# Patient Record
Sex: Female | Born: 1965 | Race: Black or African American | Hispanic: No | Marital: Single | Smoking: Never smoker
Health system: Southern US, Community
[De-identification: ages and names within clinical notes are randomized; demographics above are authoritative.]

## PROBLEM LIST (undated history)

## (undated) DIAGNOSIS — R7611 Nonspecific reaction to tuberculin skin test without active tuberculosis: Secondary | ICD-10-CM

## (undated) DIAGNOSIS — G43909 Migraine, unspecified, not intractable, without status migrainosus: Secondary | ICD-10-CM

## (undated) HISTORY — DX: Migraine, unspecified, not intractable, without status migrainosus: G43.909

## (undated) HISTORY — DX: Nonspecific reaction to tuberculin skin test without active tuberculosis: R76.11

## (undated) HISTORY — PX: UTERINE FIBROID SURGERY: SHX826

## (undated) HISTORY — PX: ABDOMINAL HYSTERECTOMY: SHX81

## (undated) HISTORY — PX: MYOMECTOMY: SHX85

## (undated) HISTORY — PX: LEFT OOPHORECTOMY: SHX1961

---

## 2016-12-17 ENCOUNTER — Emergency Department
Admission: EM | Admit: 2016-12-17 | Discharge: 2016-12-17 | Disposition: A | Discharge status: Home / Self Care | Payer: Managed Care, Other (non HMO) | Attending: Emergency Medicine | Admitting: Emergency Medicine

## 2016-12-17 DIAGNOSIS — M5440 Lumbago with sciatica, unspecified side: Secondary | ICD-10-CM | POA: Insufficient documentation

## 2016-12-17 DIAGNOSIS — M545 Low back pain, unspecified: Secondary | ICD-10-CM

## 2016-12-17 LAB — URINALYSIS, COMPLETE (UACMP) WITH MICROSCOPIC
Bacteria, UA: NONE SEEN
Bilirubin Urine: NEGATIVE
Glucose, UA: NEGATIVE mg/dL
Hgb urine dipstick: NEGATIVE
KETONES UR: NEGATIVE mg/dL
Leukocytes, UA: NEGATIVE
Nitrite: NEGATIVE
PROTEIN: NEGATIVE mg/dL
Specific Gravity, Urine: 1.017 (ref 1.005–1.030)
pH: 6 (ref 5.0–8.0)

## 2016-12-17 MED ORDER — METHOCARBAMOL 500 MG PO TABS: 500.0000 mg | ORAL_TABLET | Freq: Four times a day (QID) | ORAL | 0 refills | Status: DC

## 2016-12-17 MED ORDER — KETOROLAC TROMETHAMINE 30 MG/ML IJ SOLN
30.0000 mg | Freq: Once | INTRAMUSCULAR | Status: AC
  Administered 2016-12-17: 30 mg via INTRAMUSCULAR
  Filled 2016-12-17: qty 1

## 2016-12-17 MED ORDER — IBUPROFEN 600 MG PO TABS: 600.0000 mg | ORAL_TABLET | Freq: Three times a day (TID) | ORAL | 0 refills | Status: DC | PRN

## 2016-12-17 NOTE — ED Notes (Signed)
NAD noted at time of D/C. Pt denies questions or concerns. Pt ambulatory to the lobby at this time.  

## 2016-12-17 NOTE — Discharge Instructions (Signed)
Follow up with [facility] if any continued problems  Ibuprofen for inflammation and pain, robaxin to use for muscle relaxant.   Do not take the muscle relaxant and drive. Ice or heat to your back as needed for comfort

## 2016-12-17 NOTE — ED Triage Notes (Signed)
Patient reports lower back pain for couple weeks.  Thought maybe she had lifted something and strained back.  Reports some relief with Tylenol or Aleve.

## 2016-12-17 NOTE — ED Provider Notes (Signed)
[facility] Emergency Department Provider Note  ____________________________________________   First MD Initiated Contact with Patient 12/17/16 [phone]     (approximate)  I have reviewed the triage vital signs and the nursing notes.   HISTORY  Chief Complaint Back Pain    HPI Maria Farley is a 51 y.o. female is here complaining of low back pain for several weeks. Patient states that she thought that she lifted something which strained her back. She's been taking Tylenol and Aleve with some relief. She denies any urinary symptoms or history of kidney stones. Patient states that pain is worse with deep inspiration and also with lying on her back. She denies any paresthesias, incontinence of bowel or bladder, or saddle anesthesias. Patient rates her pain as a 7 out of 10.   No past medical history on file.  There are no active problems to display for this patient.   No past surgical history on file.  Prior to Admission medications   Medication Sig Start Date End Date Taking? Authorizing Provider  ibuprofen (ADVIL,MOTRIN) 600 MG tablet Take 1 tablet (600 mg total) by mouth every 8 (eight) hours as needed. 12/17/16   Tommi RumpsSummers, Nikaya Nasby L, PA-C  methocarbamol (ROBAXIN) 500 MG tablet Take 1 tablet (500 mg total) by mouth 4 (four) times daily. 12/17/16   Tommi RumpsSummers, Aiman Noe L, PA-C    Allergies Clindamycin/lincomycin  No family history on file.  Social History Social History  Substance Use Topics  . Smoking status: Not on file  . Smokeless tobacco: Not on file  . Alcohol use Not on file    Review of Systems Constitutional: No fever/chills Cardiovascular: Denies chest pain. Respiratory: Denies shortness of breath. Gastrointestinal: No abdominal pain.  No nausea, no vomiting.  No diarrhea.  Genitourinary: Negative for dysuria. Musculoskeletal: Positive for low back pain. Skin: Negative for rash. Neurological: Negative for  focal weakness or  numbness.   ____________________________________________   PHYSICAL EXAM:  VITAL SIGNS: ED Triage Vitals  Enc Vitals Group     BP 12/17/16 0540 138/83     Pulse Rate 12/17/16 0540 74     Resp 12/17/16 0540 17     Temp 12/17/16 0540 97.7 F (36.5 C)     Temp Source 12/17/16 0540 Oral     SpO2 12/17/16 0540 98 %     Weight 12/17/16 0541 160 lb (72.6 kg)     Height 12/17/16 0541 5\' 11"  (1.803 m)     Head Circumference --      Peak Flow --      Pain Score 12/17/16 0539 7     Pain Loc --      Pain Edu? --      Excl. in GC? --     Constitutional: Alert and oriented. Well appearing and in no acute distress. Eyes: Conjunctivae are normal. PERRL. EOMI. Head: Atraumatic. Cardiovascular: Normal rate, regular rhythm. Grossly normal heart sounds.  Good peripheral circulation. Respiratory: Normal respiratory effort.  No retractions. Lungs CTAB. Gastrointestinal: Soft and nontender. No distention.  Musculoskeletal: Moves upper and lower extremities pain difficulty. Normal gait was noted. Good muscle strength bilaterally. Straight leg raises are negative. On  examination of the back there is soft tissue tenderness bilaterally paravertebral muscles. There is no tenderness on palpation of the vertebral bodies lumbar spine. Range of motion is unrestricted. Neurologic:  Normal speech and language. No gross focal neurologic deficits are appreciated. No gait instability. Skin:  Skin is warm, dry and intact. No rash noted.  Psychiatric: Mood and affect are normal. Speech and behavior are normal.  ____________________________________________   LABS (all labs ordered are listed, but only abnormal results are displayed)  Labs Reviewed  URINALYSIS, COMPLETE (UACMP) WITH MICROSCOPIC - Abnormal; Notable for the following:       Result Value   Color, Urine YELLOW (*)    APPearance CLEAR (*)    Squamous Epithelial / LPF 0-5 (*)    All other components within normal limits     PROCEDURES  Procedure(s) performed: None  Procedures  Critical Care performed: No  ____________________________________________   INITIAL IMPRESSION / ASSESSMENT AND PLAN / ED COURSE  Pertinent labs & imaging results that were available during my care of the patient were reviewed by me and considered in my medical decision making (see chart for details).  Patient is to follow-up with [facility] acute-care if any continued problems. She is given a prescription for ibuprofen 600 mg 3 times a day with food along with methocarbamol 500 mg one 4 times a day as needed for muscle spasms. She is aware that she cannot take this medication while driving. She was given Toradol 30 mg IM in the department. Patient was ambulatory without assistance after discharge.      ____________________________________________   FINAL CLINICAL IMPRESSION(S) / ED DIAGNOSES  Final diagnoses:  Acute bilateral low back pain without sciatica      NEW MEDICATIONS STARTED DURING THIS VISIT:  Discharge Medication List as of 12/17/2016  8:53 AM    START taking these medications   Details  ibuprofen (ADVIL,MOTRIN) 600 MG tablet Take 1 tablet (600 mg total) by mouth every 8 (eight) hours as needed., Starting Sun 12/17/2016, Print    methocarbamol (ROBAXIN) 500 MG tablet Take 1 tablet (500 mg total) by mouth 4 (four) times daily., Starting Sun 12/17/2016, Print         Note:  This document was prepared using Dragon voice recognition software and may include unintentional dictation errors.   Tommi Rumps, PA-C 12/17/16 1027    Governor Rooks, MD 12/17/16 [phone]

## 2018-04-17 ENCOUNTER — Other Ambulatory Visit: Payer: Self-pay

## 2018-04-17 ENCOUNTER — Ambulatory Visit (INDEPENDENT_AMBULATORY_CARE_PROVIDER_SITE_OTHER): Payer: Managed Care, Other (non HMO)

## 2018-04-17 ENCOUNTER — Encounter: Payer: Self-pay | Admitting: Emergency Medicine

## 2018-04-17 ENCOUNTER — Ambulatory Visit
Admission: EM | Admit: 2018-04-17 | Discharge: 2018-04-17 | Disposition: A | Discharge status: Home / Self Care | Payer: Managed Care, Other (non HMO) | Attending: Family Medicine | Admitting: Family Medicine

## 2018-04-17 DIAGNOSIS — X58XXXA Exposure to other specified factors, initial encounter: Secondary | ICD-10-CM

## 2018-04-17 DIAGNOSIS — M7652 Patellar tendinitis, left knee: Secondary | ICD-10-CM

## 2018-04-17 DIAGNOSIS — M25562 Pain in left knee: Secondary | ICD-10-CM | POA: Diagnosis not present

## 2018-04-17 DIAGNOSIS — S8392XA Sprain of unspecified site of left knee, initial encounter: Secondary | ICD-10-CM

## 2018-04-17 NOTE — ED Triage Notes (Signed)
Pt c/o left knee pain. Started about a week ago. She has knee swelling also. No known trauma. She feels like her knee will not hold her up.

## 2018-04-17 NOTE — Discharge Instructions (Signed)
Rest, ice, elevate, ibuprofen 600mg  three times daily

## 2018-04-17 NOTE — ED Provider Notes (Signed)
[facility]    CSN: [id number] Arrival date & time: 04/17/18  1723     History   Chief Complaint Chief Complaint  Patient presents with  . Knee Pain    left    HPI Maria Farley is a 52 y.o. female.   The history is provided by the patient.  Knee Pain  Location:  Knee Time since incident:  1 week Injury: no   Knee location:  L knee Pain details:    Quality:  Aching Chronicity:  New Dislocation: no   Foreign body present:  No foreign bodies Prior injury to area:  No Relieved by:  None tried Ineffective treatments:  None tried Associated symptoms: swelling   Associated symptoms: no back pain, no fatigue, no fever, no itching, no numbness, no stiffness and no tingling   Risk factors: no concern for non-accidental trauma, no frequent fractures, no known bone disorder, no obesity and no recent illness     History reviewed. No pertinent past medical history.  There are no active problems to display for this patient.   Past Surgical History:  Procedure Laterality Date  . ABDOMINAL HYSTERECTOMY    . UTERINE FIBROID SURGERY      OB History   None      Home Medications    Prior to Admission medications   Medication Sig Start Date End Date Taking? Authorizing Provider  ibuprofen (ADVIL,MOTRIN) 600 MG tablet Take 1 tablet (600 mg total) by mouth every 8 (eight) hours as needed. 12/17/16   Tommi Rumps, PA-C  methocarbamol (ROBAXIN) 500 MG tablet Take 1 tablet (500 mg total) by mouth 4 (four) times daily. 12/17/16   Tommi Rumps, PA-C    Family History History reviewed. No pertinent family history.  Social History Social History   Tobacco Use  . Smoking status: Never Smoker  . Smokeless tobacco: Never Used  Substance Use Topics  . Alcohol use: Yes  . Drug use: Never     Allergies   Clindamycin/lincomycin   Review of Systems Review of Systems  Constitutional: Negative for fatigue and fever.  Musculoskeletal: Negative for back  pain and stiffness.  Skin: Negative for itching.     Physical Exam Triage Vital Signs ED Triage Vitals  Enc Vitals Group     BP 04/17/18 1738 137/89     Pulse Rate 04/17/18 1738 88     Resp 04/17/18 1738 17     Temp 04/17/18 1738 98.9 F (37.2 C)     Temp Source 04/17/18 1738 Oral     SpO2 04/17/18 1738 97 %     Weight 04/17/18 1736 160 lb (72.6 kg)     Height 04/17/18 1736 4\' 11"  (1.499 m)     Head Circumference --      Peak Flow --      Pain Score 04/17/18 1736 4     Pain Loc --      Pain Edu? --      Excl. in GC? --    No data found.  Updated Vital Signs BP 137/89 (BP Location: Left Arm)   Pulse 88   Temp 98.9 F (37.2 C) (Oral)   Resp 17   Ht 4\' 11"  (1.499 m)   Wt 72.6 kg   SpO2 97%   BMI 32.32 kg/m   Visual Acuity Right Eye Distance:   Left Eye Distance:   Bilateral Distance:    Right Eye Near:   Left Eye Near:    Bilateral  Near:     Physical Exam  Constitutional: She appears well-developed and well-nourished. No distress.  Musculoskeletal:       Left knee: She exhibits swelling and bony tenderness. She exhibits normal range of motion, no effusion, no ecchymosis, no deformity, no laceration, no erythema, normal alignment, no LCL laxity, normal patellar mobility, normal meniscus and no MCL laxity. Tenderness found. Patellar tendon tenderness noted. No medial joint line, no lateral joint line, no MCL and no LCL tenderness noted.  Skin: She is not diaphoretic.  Nursing note and vitals reviewed.    UC Treatments / Results  Labs (all labs ordered are listed, but only abnormal results are displayed) Labs Reviewed - No data to display  EKG None  Radiology Dg Knee Complete 4 Views Left  Result Date: 04/17/2018 CLINICAL DATA:  Left knee pain for 1 week, no known injury, initial encounter EXAM: LEFT KNEE - COMPLETE 4+ VIEW COMPARISON:  None. FINDINGS: No evidence of fracture, dislocation, or joint effusion. No evidence of arthropathy or other focal bone  abnormality. Soft tissues are unremarkable. IMPRESSION: No acute abnormality noted. Electronically Signed   By: Alcide Clever M.D.   On: 04/17/2018 18:32    Procedures Procedures (including critical care time)  Medications Ordered in UC Medications - No data to display  Initial Impression / Assessment and Plan / UC Course  I have reviewed the triage vital signs and the nursing notes.  Pertinent labs & imaging results that were available during my care of the patient were reviewed by me and considered in my medical decision making (see chart for details).      Final Clinical Impressions(s) / UC Diagnoses   Final diagnoses:  Patellar tendinitis of left knee  Sprain of left knee, unspecified ligament, initial encounter    ED Prescriptions    None     1. x-ray results and diagnosis reviewed with patient 2. rx as per orders above; reviewed possible side effects, interactions, risks and benefits  3. Recommend supportive treatment with rest, ice, otc analgesics 4. Follow-up prn if symptoms worsen or don't improve    Controlled Substance Prescriptions [place] Controlled Substance Registry consulted? Not Applicable   Payton Mccallum, MD 04/17/18 2110

## 2019-03-28 IMAGING — CR DG KNEE COMPLETE 4+V*L*
4 series · 4 of 4 positions shown · non-contrast
Comparison: None.

CLINICAL DATA: Left knee pain for 1 week, no known injury, initial
encounter

EXAM:
LEFT KNEE - COMPLETE 4+ VIEW

[knee ap]
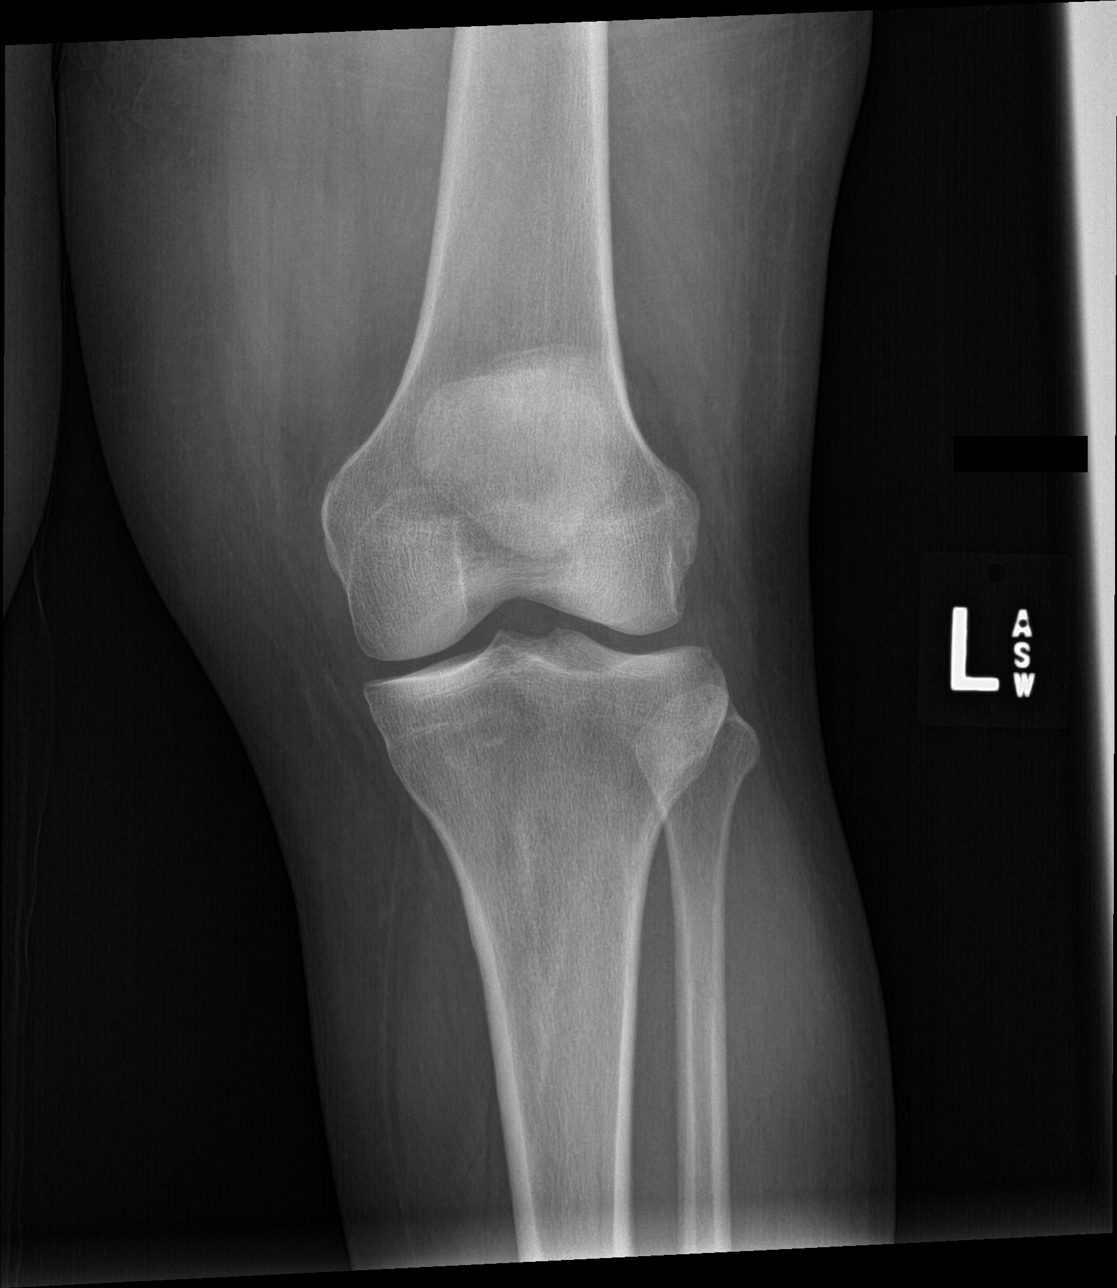

[knee lat]
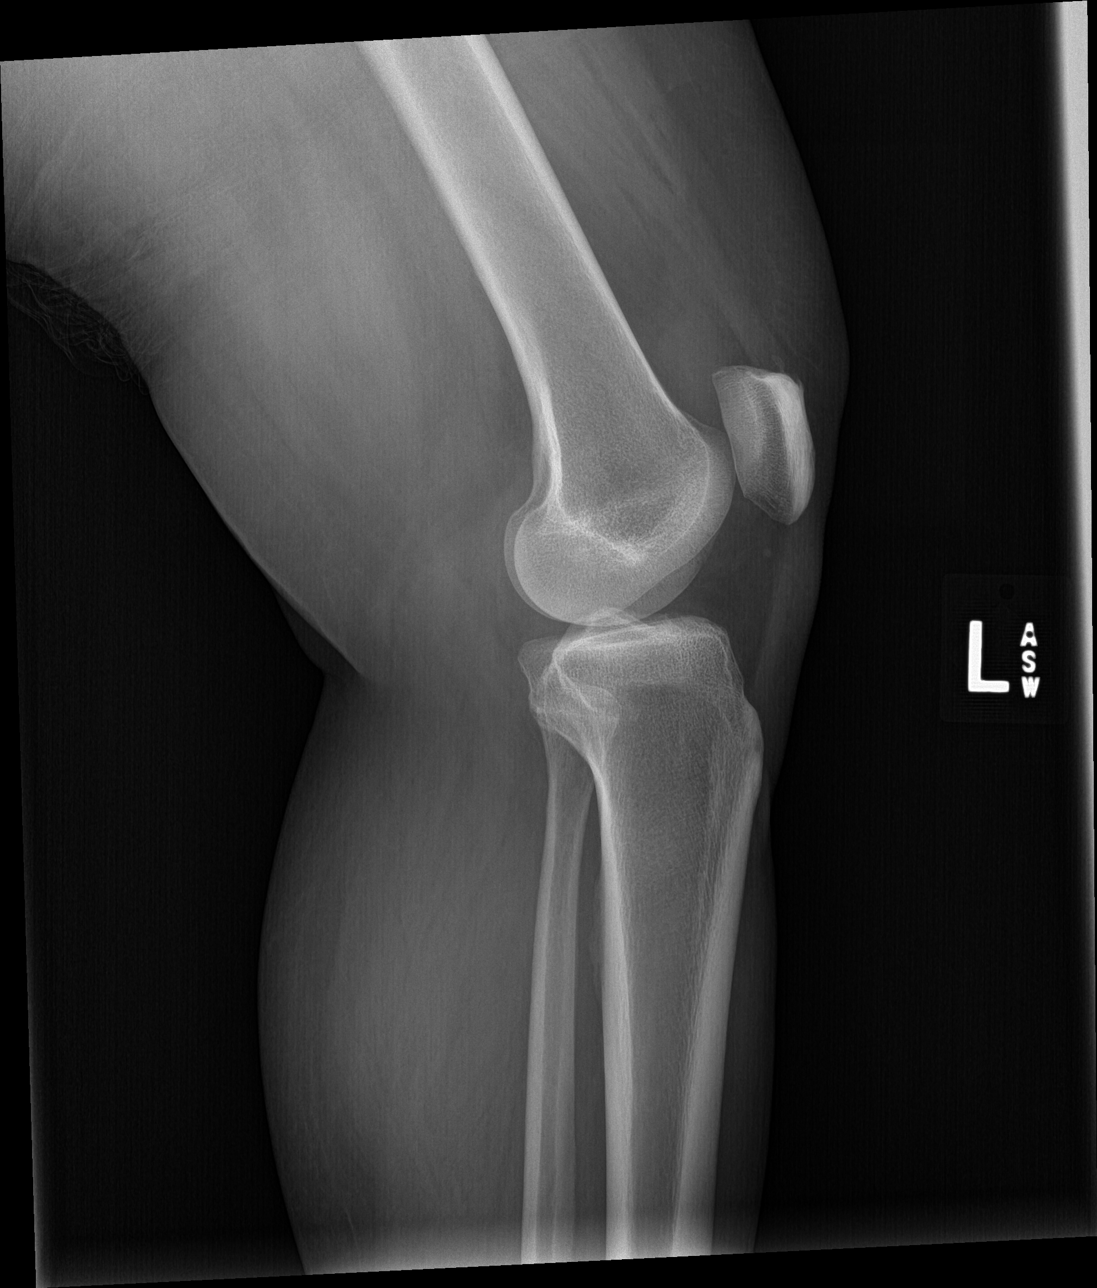

[tunnel]
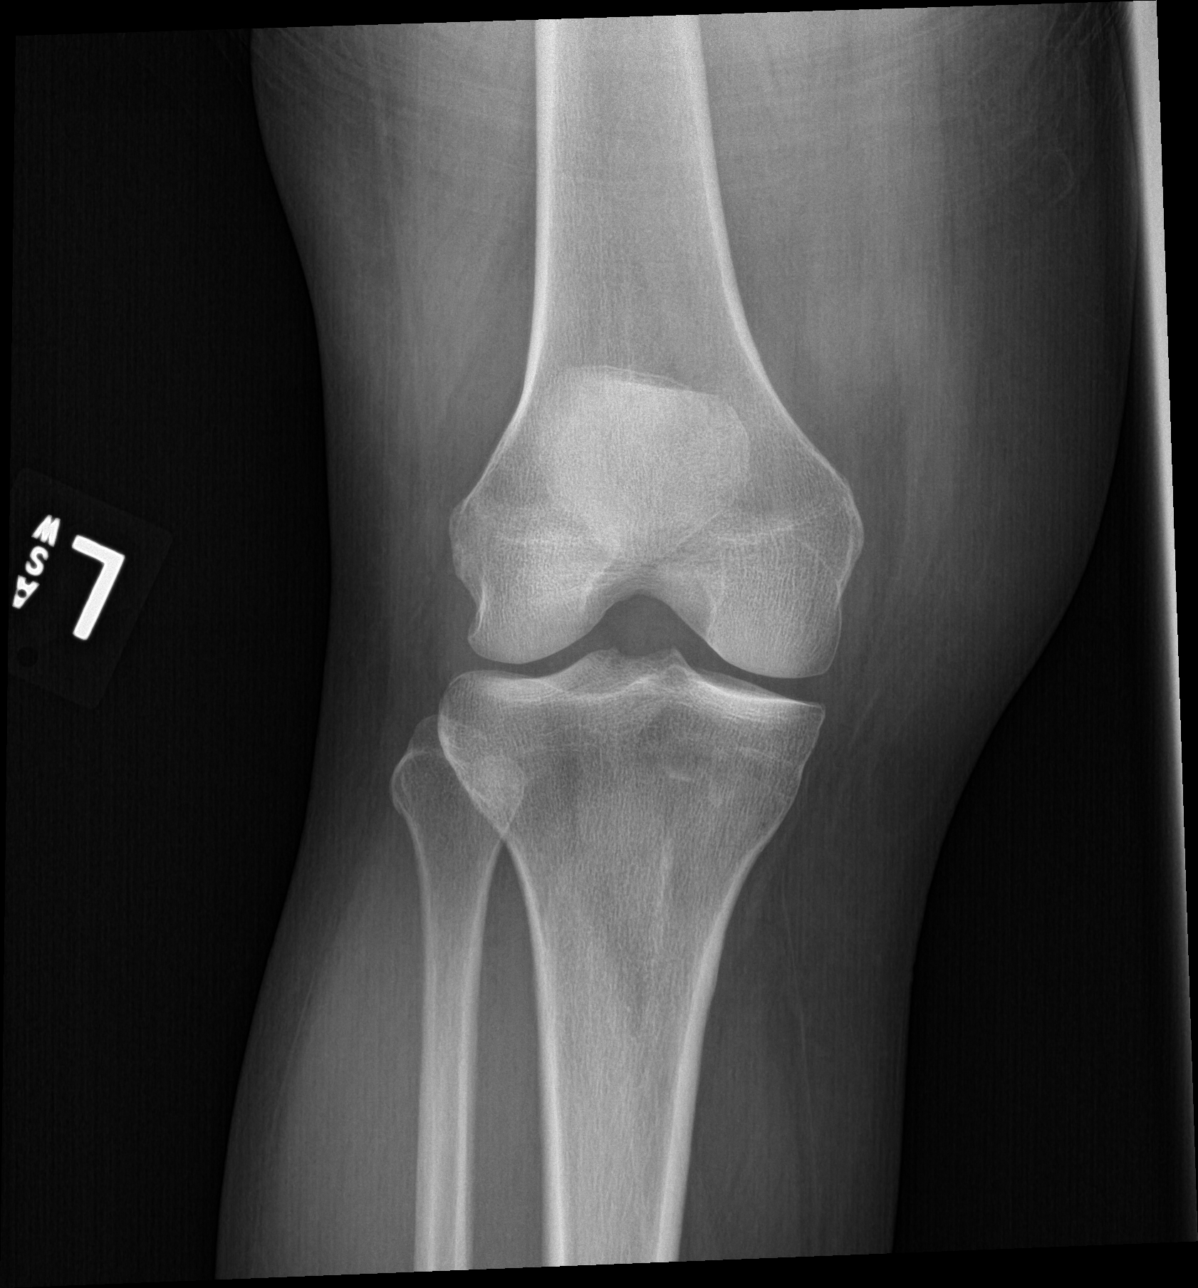

[patella skyline]
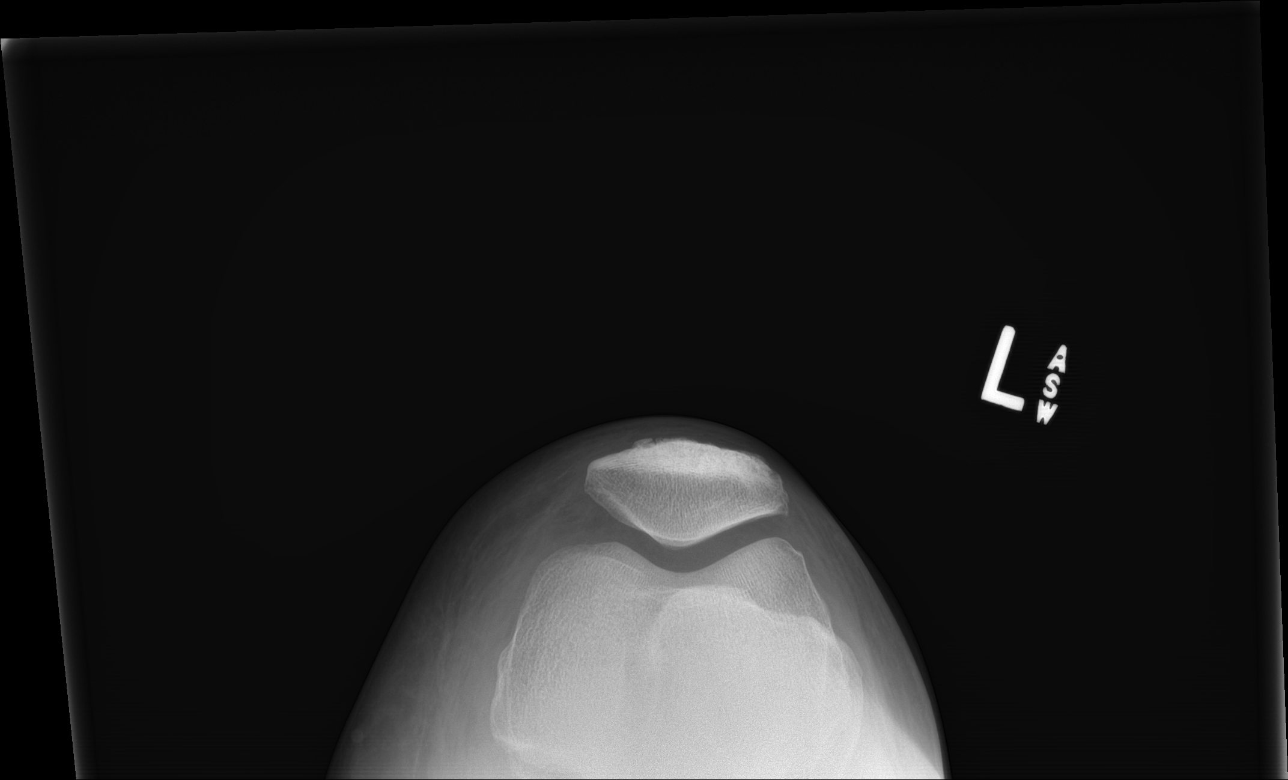

[4 of 4 positions shown; findings below may reference images not displayed]

FINDINGS: No evidence of fracture, dislocation, or joint effusion. No evidence
of arthropathy or other focal bone abnormality. Soft tissues are
unremarkable.
IMPRESSION: No acute abnormality noted.

## 2019-09-30 ENCOUNTER — Other Ambulatory Visit: Payer: Self-pay

## 2019-09-30 ENCOUNTER — Encounter: Payer: Self-pay | Admitting: Family Medicine

## 2019-09-30 ENCOUNTER — Ambulatory Visit: Payer: Managed Care, Other (non HMO) | Admitting: Family Medicine

## 2019-09-30 VITALS — BP 132/80 | HR 88 | Temp 97.7°F | Ht 59.25 in | Wt 159.8 lb

## 2019-09-30 DIAGNOSIS — Z13220 Encounter for screening for lipoid disorders: Secondary | ICD-10-CM

## 2019-09-30 DIAGNOSIS — Z1211 Encounter for screening for malignant neoplasm of colon: Secondary | ICD-10-CM

## 2019-09-30 DIAGNOSIS — E669 Obesity, unspecified: Secondary | ICD-10-CM | POA: Diagnosis not present

## 2019-09-30 DIAGNOSIS — Z114 Encounter for screening for human immunodeficiency virus [HIV]: Secondary | ICD-10-CM

## 2019-09-30 DIAGNOSIS — L989 Disorder of the skin and subcutaneous tissue, unspecified: Secondary | ICD-10-CM

## 2019-09-30 DIAGNOSIS — E66811 Obesity, class 1: Secondary | ICD-10-CM

## 2019-09-30 LAB — COMPREHENSIVE METABOLIC PANEL
ALT: 13 U/L (ref 0–35)
AST: 18 U/L (ref 0–37)
Albumin: 4.2 g/dL (ref 3.5–5.2)
Alkaline Phosphatase: 64 U/L (ref 39–117)
BUN: 9 mg/dL (ref 6–23)
CO2: 30 mEq/L (ref 19–32)
Calcium: 9.7 mg/dL (ref 8.4–10.5)
Chloride: 100 mEq/L (ref 96–112)
Creatinine, Ser: 0.51 mg/dL (ref 0.40–1.20)
GFR: 151.41 mL/min (ref >60.00)
Glucose, Bld: 88 mg/dL (ref 70–99)
Potassium: 3.6 mEq/L (ref 3.5–5.1)
Sodium: 136 mEq/L (ref 135–145)
Total Bilirubin: 0.6 mg/dL (ref 0.2–1.2)
Total Protein: 7.6 g/dL (ref 6.0–8.3)

## 2019-09-30 LAB — LIPID PANEL
Cholesterol: 179 mg/dL (ref 0–200)
HDL: 64.7 mg/dL (ref >39.00)
LDL Cholesterol: 97 mg/dL (ref 0–99)
NonHDL: 114.13
Total CHOL/HDL Ratio: 3
Triglycerides: 86 mg/dL (ref 0.0–149.0)
VLDL: 17.2 mg/dL (ref 0.0–40.0)

## 2019-09-30 NOTE — Patient Instructions (Signed)
Great to meet you!  Labs today  Dermatology appointment   Return 1 year for wellness or sooner if anything comes up

## 2019-09-30 NOTE — Assessment & Plan Note (Signed)
Diagnosis unclear. Could be age spots but they are slightly raised and she has no other lesions. Referral to dermatology for further diagnosis.

## 2019-09-30 NOTE — Progress Notes (Signed)
Subjective:     Maria Farley is a 54 y.o. female presenting for Establish Care (previous PCP in [place]. Has not been seen by anyone since 2017.), Hip Pain (right- in January 2021-lasted for 1 week), Joint Swelling (left, happened in 2020), and Shoulder Pain (right, February 2021-lasted for about 4 days)     HPI  #Breast pain - thinks she pulled a muscle and it has improved - was worse with arm movement and has resolved - last mammogram was 2016  #Joint pain - initially left knee pain and swelling - improved now, but swelling w/o injury - Right hip pain - hip pain near the sits bone which has improved - right shoulder pain - will hear it pop every now and then, and it was painful as above but resolved - hasn't been bad enough to take anything  - did pull muscle at work and got muscle relaxers which helped and the symptoms eventually went away  #weight gain - less than 10 lb gain - feels like she is limiting her diet w/o any weight loss  Review of Systems   Social History   Tobacco Use  Smoking Status Never Smoker  Smokeless Tobacco Never Used        Objective:    BP Readings from Last 3 Encounters:  09/30/19 132/80  04/17/18 137/89  12/17/16 130/77   Wt Readings from Last 3 Encounters:  09/30/19 159 lb 12 oz (72.5 kg)  04/17/18 160 lb (72.6 kg)  12/17/16 160 lb (72.6 kg)    BP 132/80   Pulse 88   Temp 97.7 F (36.5 C)   Ht 4' 11.25" (1.505 m)   Wt 159 lb 12 oz (72.5 kg)   SpO2 95%   BMI 31.99 kg/m    Physical Exam Constitutional:      General: She is not in acute distress.    Appearance: She is well-developed. She is not diaphoretic.  HENT:     Right Ear: External ear normal.     Left Ear: External ear normal.     Nose: Nose normal.  Eyes:     Conjunctiva/sclera: Conjunctivae normal.  Cardiovascular:     Rate and Rhythm: Normal rate and regular rhythm.     Heart sounds: No murmur.  Pulmonary:     Effort: Pulmonary effort is normal. No  respiratory distress.     Breath sounds: Normal breath sounds. No wheezing.  Musculoskeletal:     Cervical back: Neck supple.  Skin:    General: Skin is warm and dry.     Capillary Refill: Capillary refill takes less than 2 seconds.     Comments: Right dorsal surface of the hand with several macular dark irregularly shaped lesions.   Neurological:     Mental Status: She is alert. Mental status is at baseline.  Psychiatric:        Mood and Affect: Mood normal.        Behavior: Behavior normal.           Assessment & Plan:   Problem List Items Addressed This Visit    None    Visit Diagnoses    Obesity (BMI 30.0-34.9)    -  Primary   Relevant Orders   Comprehensive metabolic panel (Completed)   Lipid panel (Completed)   Screening for hyperlipidemia       Relevant Orders   Lipid panel (Completed)   Screening for HIV (human immunodeficiency virus)       Relevant Orders  HIV Antibody (routine testing w rflx)   Colon cancer screening       Relevant Orders   Fecal occult blood, imunochemical   Skin lesion of hand       Relevant Orders   Ambulatory referral to Dermatology       Return in about 1 year (around 09/29/2020).  Lesleigh Noe, MD

## 2019-09-30 NOTE — Assessment & Plan Note (Signed)
Labs reassuring. Encouraged regular exercise and healthy diet.

## 2019-10-01 ENCOUNTER — Telehealth: Payer: Self-pay

## 2019-10-01 LAB — HIV ANTIBODY (ROUTINE TESTING W REFLEX): HIV 1&2 Ab, 4th Generation: NONREACTIVE

## 2019-10-01 NOTE — Telephone Encounter (Signed)
Called patient but could not leave a message about lab results.

## 2019-10-08 ENCOUNTER — Other Ambulatory Visit (INDEPENDENT_AMBULATORY_CARE_PROVIDER_SITE_OTHER): Payer: Managed Care, Other (non HMO)

## 2019-10-08 DIAGNOSIS — Z1211 Encounter for screening for malignant neoplasm of colon: Secondary | ICD-10-CM | POA: Diagnosis not present

## 2019-10-08 LAB — FECAL OCCULT BLOOD, IMMUNOCHEMICAL: Fecal Occult Bld: NEGATIVE

## 2024-08-13 ENCOUNTER — Ambulatory Visit

## 2024-08-13 ENCOUNTER — Ambulatory Visit
Admission: EM | Admit: 2024-08-13 | Discharge: 2024-08-13 | Disposition: A | Discharge status: Home / Self Care | Source: Home / Self Care | Attending: Family Medicine | Admitting: Family Medicine

## 2024-08-13 DIAGNOSIS — J111 Influenza due to unidentified influenza virus with other respiratory manifestations: Secondary | ICD-10-CM

## 2024-08-13 DIAGNOSIS — R051 Acute cough: Secondary | ICD-10-CM

## 2024-08-13 DIAGNOSIS — R0789 Other chest pain: Secondary | ICD-10-CM

## 2024-08-13 LAB — POC SOFIA SARS ANTIGEN FIA: SARS Coronavirus 2 Ag: NEGATIVE

## 2024-08-13 LAB — POCT INFLUENZA A/B
Influenza A, POC: NEGATIVE
Influenza B, POC: NEGATIVE

## 2024-08-13 MED ORDER — PROMETHAZINE-DM 6.25-15 MG/5ML PO SYRP: 5.0000 mL | ORAL_SOLUTION | Freq: Four times a day (QID) | ORAL | 0 refills | Status: AC | PRN

## 2024-08-13 NOTE — ED Provider Notes (Incomplete)
 " [facility]    CSN: [id number] Arrival date & time: 08/13/24  1548      History   Chief Complaint Chief Complaint  Patient presents with   Generalized Body Aches    HPI Caedyn Tassinari is a 59 y.o. female.   HPI  History obtained from the patient. Tameka presents for body aches, sharp pain with deep breathing, chills, productive cough, diarrhea, nausea and lightheadedness. She has not been eating well.  Endorses headache. She has been drinking well. No fever and vomiting.  She took some Theraflu for night-time, Sudafed and Mucinex. She works a the dialysis center.    No history of asthma. Denies vaping and smoking.      Past Medical History:  Diagnosis Date   Migraine    Positive skin test for tuberculosis    but chest xray was negative    Patient Active Problem List   Diagnosis Date Noted   Obesity (BMI 30.0-34.9) 09/30/2019   Skin lesion of hand 09/30/2019    Past Surgical History:  Procedure Laterality Date   ABDOMINAL HYSTERECTOMY     partial-has cervix and right vary   CESAREAN SECTION     LEFT OOPHORECTOMY     MYOMECTOMY     UTERINE FIBROID SURGERY      OB History   No obstetric history on file.      Home Medications    Prior to Admission medications  Medication Sig Start Date End Date Taking? Authorizing Provider  Multiple Vitamin (MULTIVITAMIN) tablet Take 1 tablet by mouth daily.    [provider]    Family History Family History  Problem Relation Age of Onset   Lung cancer Father        smoker   Heart disease Sister    Hyperlipidemia Sister    Hypertension Sister    Stroke Sister 24   Heart attack Sister 33       smoker    Social History Social History[1]   Allergies   Clindamycin/lincomycin   Review of Systems Review of Systems: negative unless otherwise stated in HPI.      Physical Exam Triage Vital Signs ED Triage Vitals  Encounter Vitals Group     BP 08/13/24 1636 137/79     Girls Systolic  BP Percentile --      Girls Diastolic BP Percentile --      Boys Systolic BP Percentile --      Boys Diastolic BP Percentile --      Pulse Rate 08/13/24 1636 (!) 105     Resp 08/13/24 1636 16     Temp 08/13/24 1636 98.8 F (37.1 C)     Temp Source 08/13/24 1636 Oral     SpO2 08/13/24 1636 99 %     Weight 08/13/24 1636 163 lb (73.9 kg)     Height --      Head Circumference --      Peak Flow --      Pain Score 08/13/24 1641 7     Pain Loc --      Pain Education --      Exclude from Growth Chart --    No data found.  Updated Vital Signs BP 137/79 (BP Location: Left Arm)   Pulse (!) 105   Temp 98.8 F (37.1 C) (Oral)   Resp 16   Wt 73.9 kg   SpO2 99%   BMI 32.64 kg/m   Visual Acuity Right Eye Distance:   Left Eye  Distance:   Bilateral Distance:    Right Eye Near:   Left Eye Near:    Bilateral Near:     Physical Exam GEN:     alert, non-toxic appearing female in no distress ***   HENT:  mucus membranes moist, oropharyngeal ***without lesions or ***erythema, no*** tonsillar hypertrophy or exudates, *** moderate erythematous edematous turbinates, ***clear nasal discharge, ***bilateral TM normal EYES:   pupils equal and reactive, ***no scleral injection or discharge NECK:  normal ROM, no ***lymphadenopathy, ***no meningismus   RESP:  no increased work of breathing, ***clear to auscultation bilaterally CVS:   regular rhythm, tachycardic  Skin:   warm and dry, no rash on visible skin***    UC Treatments / Results  Labs (all labs ordered are listed, but only abnormal results are displayed) Labs Reviewed  POC SOFIA SARS ANTIGEN FIA - Normal  POCT INFLUENZA A/B - Normal    EKG   Radiology No results found.   Procedures Procedures (including critical care time)  Medications Ordered in UC Medications - No data to display  Initial Impression / Assessment and Plan / UC Course  I have reviewed the triage vital signs and the nursing notes.  Pertinent labs &  imaging results that were available during my care of the patient were reviewed by me and considered in my medical decision making (see chart for details).       Pt is a 59 y.o. female who presents for *** days of respiratory symptoms. Marga is ***afebrile here without recent antipyretics. Satting well on room air. Overall pt is ***non-toxic appearing, well hydrated, without respiratory distress. Pulmonary exam ***is unremarkable.  POC COVID and influenza panel obtained ***and was negative. ***POC strep is ***.  Strep throat culture sent.   Suspect ***viral respiratory illness. Discussed symptomatic treatment.  ***Promethazine  DM for cough. ***Atrovent nasal spray for nasal congestion.  ***Explained lack of efficacy of antibiotics in viral disease.  Typical duration of symptoms discussed.   Return and ED precautions given and voiced understanding. Discussed MDM, treatment plan and plan for follow-up with patient*** who agrees with plan.     Final Clinical Impressions(s) / UC Diagnoses   Final diagnoses:  None   Discharge Instructions   None    ED Prescriptions   None    PDMP not reviewed this encounter.     [1]  Social History Tobacco Use   Smoking status: Never   Smokeless tobacco: Never  Vaping Use   Vaping status: Never Used  Substance Use Topics   Alcohol use: Yes    Comment: once or twice a month   Drug use: Never   "

## 2024-08-13 NOTE — ED Triage Notes (Signed)
 Patient to Urgent Care with complaints of  body aches (report back/ hips/ breasts are sore)/ nasal congestion/ nose bleeds/ cough- productive and green/ chills.   Symptoms started Monday.   Taking theraflu/ sudafed/ mucinex

## 2024-08-13 NOTE — Discharge Instructions (Addendum)
 Your chest xray was concerning for pulmonary arterial hypertension. There was no pneumonia or fluid buildup in your lungs. Your EKG showed an slightly elevated but otherwise normal. Follow up with your primary care provider or cardiologist.   Your influenza and COVID are negative.  You most likely have a viral respiratory infection that will gradually improve over the next 7-10 days. Cough may last up to 3 weeks. If your were prescribed medication, stop by the pharmacy to pick them up.  You can take Tylenol and/or Ibuprofen  as needed for fever reduction and pain relief.    For cough: honey 1/2 to 1 teaspoon (you can dilute the honey in water or another fluid).  Stop at the pharmacy to pick up your prescription cough medication.  You can use a humidifier for chest congestion and cough.  If you don't have a humidifier, you can sit in the bathroom with the hot shower running.      For congestion: take a daily anti-histamine like Zyrtec, Claritin, and a oral decongestant, such as pseudoephedrine.  Saline nasal sprays can be used frequently to clear nasal congestion. Afrin is also a good option, if you do not have high blood pressure.    It is important to stay hydrated: drink plenty of fluids (water, gatorade/powerade/pedialyte, juices, or teas) to keep your throat moisturized and help further relieve irritation/discomfort.    Return or go to the Emergency Department if symptoms worsen or do not improve in the next few days     Go to ED for red flag symptoms, including; worsening chest pain that is accompanied by nausea with vomiting, sweating, pain in your neck or jaw. Severe headaches, vision changes, numbness/weakness in part of the body, lethargy, confusion, terrible vomiting, severe dehydration, breathing difficulty, severe persistent abdominal, feeling faint or passing out, dizziness, etc. You should especially go to the ED for sudden acute worsening of condition if you do not elect to go at this  time.

## 2024-09-11 ENCOUNTER — Ambulatory Visit: Admitting: Family Medicine
